# Patient Record
Sex: Male | Born: 1990 | Race: Black or African American | Hispanic: No | Marital: Single | Smoking: Current every day smoker
Health system: Southern US, Community
[De-identification: ages and names within clinical notes are randomized; demographics above are authoritative.]

## PROBLEM LIST (undated history)

## (undated) DIAGNOSIS — I10 Essential (primary) hypertension: Secondary | ICD-10-CM

---
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
## 2011-01-09 ENCOUNTER — Emergency Department (HOSPITAL_COMMUNITY)
Admission: EM | Admit: 2011-01-09 | Discharge: 2011-01-09 | Disposition: A | Discharge status: Home / Self Care | Payer: Medicaid Other | Attending: Emergency Medicine | Admitting: Emergency Medicine

## 2011-01-09 DIAGNOSIS — F988 Other specified behavioral and emotional disorders with onset usually occurring in childhood and adolescence: Secondary | ICD-10-CM | POA: Insufficient documentation

## 2011-01-09 DIAGNOSIS — R1033 Periumbilical pain: Secondary | ICD-10-CM | POA: Insufficient documentation

## 2011-01-09 DIAGNOSIS — R1032 Left lower quadrant pain: Secondary | ICD-10-CM | POA: Insufficient documentation

## 2011-01-09 LAB — CBC
HCT: 40.2 % (ref 39.0–52.0)
Hemoglobin: 14.5 g/dL (ref 13.0–17.0)
MCH: 29.2 pg (ref 26.0–34.0)
MCV: 81 fL (ref 78.0–100.0)
Platelets: 206 10*3/uL (ref 150–400)
RBC: 4.96 MIL/uL (ref 4.22–5.81)
WBC: 5.2 10*3/uL (ref 4.0–10.5)

## 2011-01-09 LAB — URINE MICROSCOPIC-ADD ON

## 2011-01-09 LAB — COMPREHENSIVE METABOLIC PANEL
ALT: 16 U/L (ref 0–53)
AST: 20 U/L (ref 0–37)
Alkaline Phosphatase: 82 U/L (ref 39–117)
Calcium: 10.1 mg/dL (ref 8.4–10.5)
Chloride: 102 mEq/L (ref 96–112)
Creatinine, Ser: 1.18 mg/dL (ref 0.50–1.35)
GFR calc non Af Amer: >60 mL/min (ref >60)
Potassium: 3.8 mEq/L (ref 3.5–5.1)

## 2011-01-09 LAB — URINALYSIS, ROUTINE W REFLEX MICROSCOPIC
Glucose, UA: NEGATIVE mg/dL
Leukocytes, UA: NEGATIVE
Specific Gravity, Urine: 1.039 — ABNORMAL HIGH (ref 1.005–1.030)
pH: 6 (ref 5.0–8.0)

## 2011-01-09 LAB — DIFFERENTIAL
Eosinophils Absolute: 0 10*3/uL (ref 0.0–0.7)
Lymphs Abs: 1.2 10*3/uL (ref 0.7–4.0)
Neutro Abs: 3.4 10*3/uL (ref 1.7–7.7)

## 2011-03-25 ENCOUNTER — Emergency Department (HOSPITAL_COMMUNITY): Payer: Medicaid Other

## 2011-03-25 ENCOUNTER — Emergency Department (HOSPITAL_COMMUNITY)
Admission: EM | Admit: 2011-03-25 | Discharge: 2011-03-25 | Discharge status: Against Medical Advice | Payer: Medicaid Other | Attending: Emergency Medicine | Admitting: Emergency Medicine

## 2011-03-25 DIAGNOSIS — R059 Cough, unspecified: Secondary | ICD-10-CM | POA: Insufficient documentation

## 2011-03-25 DIAGNOSIS — R05 Cough: Secondary | ICD-10-CM | POA: Insufficient documentation

## 2011-03-25 DIAGNOSIS — R110 Nausea: Secondary | ICD-10-CM | POA: Insufficient documentation

## 2011-03-25 DIAGNOSIS — R079 Chest pain, unspecified: Secondary | ICD-10-CM | POA: Insufficient documentation

## 2011-07-06 ENCOUNTER — Emergency Department (HOSPITAL_COMMUNITY)
Admission: EM | Admit: 2011-07-06 | Discharge: 2011-07-06 | Disposition: A | Discharge status: Home / Self Care | Payer: Medicaid Other | Attending: Emergency Medicine | Admitting: Emergency Medicine

## 2011-07-06 ENCOUNTER — Encounter (HOSPITAL_COMMUNITY): Payer: Self-pay | Admitting: *Deleted

## 2011-07-06 ENCOUNTER — Other Ambulatory Visit: Payer: Self-pay

## 2011-07-06 DIAGNOSIS — R51 Headache: Secondary | ICD-10-CM

## 2011-07-06 DIAGNOSIS — R079 Chest pain, unspecified: Secondary | ICD-10-CM | POA: Insufficient documentation

## 2011-07-06 DIAGNOSIS — M546 Pain in thoracic spine: Secondary | ICD-10-CM | POA: Insufficient documentation

## 2011-07-06 NOTE — ED Notes (Signed)
Received pt. From triage via w/c, pt. Alert and oriented, NAD noted, pt. C/o h/a and cp, cardiac monitor applied,

## 2011-07-06 NOTE — ED Notes (Signed)
The pt came in by ambulance.  He is c/o a headache and chest pain since this am.  He has a previous history of the same

## 2011-07-06 NOTE — ED Notes (Signed)
Pt. Discharged to home, pt. Alert and oriented, NAD noted, ambulatory gait steady 

## 2011-07-06 NOTE — ED Provider Notes (Signed)
History     CSN: [id number]  Arrival date & time 07/06/11  0137   First MD Initiated Contact with Patient 07/06/11 0441      Chief Complaint  Patient presents with  . Headache    (Consider location/radiation/quality/duration/timing/severity/associated sxs/prior treatment) HPI Lee [name] is a 21 y.o. male presents with c/o  chest pain, and headache  leading to desire to be assessed in the ED. The sx(s) have been present for  1 to . Additional concerns are  not . Causative factors are  not known . Palliative factors are nothing tried . The distress associated is , mild . The disorder has been present for one day . Patient denies associated nausea, vomiting, weakness, dizziness, sinus congestion, back pain, or trouble walking.     History reviewed. [name] pertinent past medical history.  History reviewed. [name] pertinent past surgical history.  History reviewed. [name] pertinent family history.  History  Substance Use Topics  . Smoking status: Current Everyday Smoker  . Smokeless tobacco: Not on file  . Alcohol Use: [name]      Review of Systems  All other systems reviewed and are negative.    Allergies  Review of patient's allergies indicates [name] known allergies.  Home Medications  [name] current outpatient prescriptions on file.  BP 105/55  Pulse 96  Temp(Src) 98.1 F (36.7 C) (Oral)  Resp 21  SpO2 98%  Physical Exam  Nursing note and vitals reviewed. Constitutional: He is oriented to person, place, and time. He appears well-developed and well-nourished.  HENT:  Head: Normocephalic and atraumatic.  Right Ear: External ear normal.  Left Ear: External ear normal.  Eyes: Conjunctivae and EOM are normal. Pupils are equal, round, and reactive to light.  Neck: Normal range of motion and phonation normal. Neck supple.  Cardiovascular: Normal rate, regular rhythm, normal heart sounds and intact distal pulses.   Pulmonary/Chest: Effort normal and breath sounds normal. He  exhibits [name] bony tenderness.  Abdominal: Soft. Normal appearance. There is [name] tenderness.  Musculoskeletal: Normal range of motion.       Mild mid thoracic spinal tenderness; [name] deformity or step-off.   Neurological: He is alert and oriented to person, place, and time. He has normal strength. [name] cranial nerve deficit or sensory deficit. He exhibits normal muscle tone. Coordination normal.       Normal gait  Skin: Skin is warm, dry and intact.  Psychiatric: He has a normal mood and affect. His behavior is normal. Judgment and thought content normal.    ED Course  Procedures (including critical care time)  Date: 07/06/2011  Rate: 65  Rhythm: normal sinus rhythm  QRS Axis: normal  Intervals: normal  ST/T Wave abnormalities: normal  Conduction Disutrbances:none  Narrative Interpretation:   Old EKG Reviewed: none available     Labs Reviewed - [name] data to display [name] results found.   1. Headache   2. Chest pain       MDM  Nonspecific Chest Pain , with normal EKG. , Patient is nontoxic. Doubt ACS, meningitis, metabolic instability or occult infection.        Flint Melter, MD 07/06/11 [phone]

## 2011-07-23 ENCOUNTER — Encounter (HOSPITAL_COMMUNITY): Payer: Self-pay | Admitting: *Deleted

## 2011-07-23 ENCOUNTER — Emergency Department (HOSPITAL_COMMUNITY)
Admission: EM | Admit: 2011-07-23 | Discharge: 2011-07-23 | Disposition: A | Discharge status: Home / Self Care | Payer: Medicaid Other | Attending: Emergency Medicine | Admitting: Emergency Medicine

## 2011-07-23 DIAGNOSIS — S29011A Strain of muscle and tendon of front wall of thorax, initial encounter: Secondary | ICD-10-CM

## 2011-07-23 DIAGNOSIS — R42 Dizziness and giddiness: Secondary | ICD-10-CM | POA: Insufficient documentation

## 2011-07-23 DIAGNOSIS — IMO0002 Reserved for concepts with insufficient information to code with codable children: Secondary | ICD-10-CM | POA: Insufficient documentation

## 2011-07-23 DIAGNOSIS — X58XXXA Exposure to other specified factors, initial encounter: Secondary | ICD-10-CM | POA: Insufficient documentation

## 2011-07-23 DIAGNOSIS — R079 Chest pain, unspecified: Secondary | ICD-10-CM | POA: Insufficient documentation

## 2011-07-23 MED ORDER — IBUPROFEN 600 MG PO TABS: 600.0000 mg | ORAL_TABLET | Freq: Four times a day (QID) | ORAL | Status: AC | PRN

## 2011-07-23 MED ORDER — KETOROLAC TROMETHAMINE 30 MG/ML IJ SOLN
30.0000 mg | Freq: Once | INTRAMUSCULAR | Status: AC
  Administered 2011-07-23: 30 mg via INTRAMUSCULAR
  Filled 2011-07-23: qty 1

## 2011-07-23 NOTE — ED Notes (Signed)
Per EMS:  Pt has had flu like symptoms for the past 5 days, st's he felt a pain in his chest that was "moving around" starting tonight.

## 2011-07-23 NOTE — ED Provider Notes (Signed)
History     CSN: [id number]  Arrival date & time 07/23/11  0128   First MD Initiated Contact with Patient 07/23/11 0142      Chief Complaint  Patient presents with  . Influenza    flu like sympotms     (Consider location/radiation/quality/duration/timing/severity/associated sxs/prior treatment) HPI Comments: His ago, while lifting weights.  He felt a pain across his chest when he stopped the pain went away.  He noticed the next day while playing basketball he had the same pain and  when he stopped playing basketball the pain went away.  He has repeated this for the last 5 days, has taken no over-the-counter medicines, but tonight, he stated the pain made him dizzy as well.  He called EMS to bring him to the emergency room. At this time he is  not having any pain nor is he dizzy  Patient is a 21 y.o. male presenting with flu symptoms. The history is provided by the patient.  Influenza The current episode started in the past 7 days. Associated symptoms include chest pain. Pertinent negatives include no chills, coughing, fatigue, nausea, vomiting or weakness. The symptoms are aggravated by exertion. He has tried nothing for the symptoms. The treatment provided mild relief.    History reviewed. No pertinent past medical history.  History reviewed. No pertinent past surgical history.  History reviewed. No pertinent family history.  History  Substance Use Topics  . Smoking status: Current Everyday Smoker  . Smokeless tobacco: Not on file  . Alcohol Use: No      Review of Systems  Constitutional: Negative for chills and fatigue.  HENT: Negative for rhinorrhea.   Respiratory: Negative for cough.   Cardiovascular: Positive for chest pain.  Gastrointestinal: Negative for nausea and vomiting.  Genitourinary: Negative for dysuria.  Musculoskeletal: Negative for back pain.  Neurological: Positive for dizziness. Negative for weakness.    Allergies  Review of patient's allergies  indicates no known allergies.  Home Medications   Current Outpatient Rx  Name Route Sig Dispense Refill  . IBUPROFEN 600 MG PO TABS Oral Take 1 tablet (600 mg total) by mouth every 6 (six) hours as needed for pain. 30 tablet 0  . IBUPROFEN 600 MG PO TABS Oral Take 1 tablet (600 mg total) by mouth every 6 (six) hours as needed for pain. 30 tablet 0    BP 121/81  Pulse 72  Temp(Src) 98.1 F (36.7 C) (Oral)  Resp 18  SpO2 100%  Physical Exam  Constitutional: He is oriented to person, place, and time. He appears well-developed.  HENT:  Head: Normocephalic.  Eyes: Pupils are equal, round, and reactive to light.  Cardiovascular: Normal rate.   Pulmonary/Chest: Effort normal. No respiratory distress. He has no wheezes. He has no rales. He exhibits tenderness.  Abdominal: Bowel sounds are normal.  Musculoskeletal: Normal range of motion.  Neurological: He is alert and oriented to person, place, and time.  Skin: Skin is warm.    ED Course  Procedures (including critical care time)  Labs Reviewed - No data to display No results found.  1. Chest wall muscle strain       MDM  This patient has reproducible chest pain will ask the nurse to administer IM, Toradol and reassess Pain has improved        Arman Filter, NP 07/23/11 0156  Arman Filter, NP 07/23/11 [phone]

## 2011-07-23 NOTE — ED Notes (Signed)
[id number]<AV> Expected date:<BR> Expected time:<BR> Means of arrival:<BR> Comments:<BR> EMS/20 yo with flu like symptoms

## 2011-07-23 NOTE — ED Notes (Signed)
Pt presents with a  C/c of flu like symptoms for 5 days, just developed chest pain tonight that "is off to the left of the chest and does not radiate"  St's he's had a cough lately that contributes to the chest pain.  No acute distress, no abdominal pain, no n/v/d, no SOB.

## 2011-07-23 NOTE — ED Provider Notes (Signed)
Medical screening examination/treatment/procedure(s) were performed by non-physician practitioner and as supervising physician I was immediately available for consultation/collaboration.   Laray Anger, DO 07/23/11 [phone]

## 2012-04-03 IMAGING — CR DG CHEST 2V
2 series · 2 of 2 positions shown · non-contrast
Comparison: None.

CLINICAL DATA: 19-year-old with chest pain.

CHEST - 2 VIEW

[w chest pa]
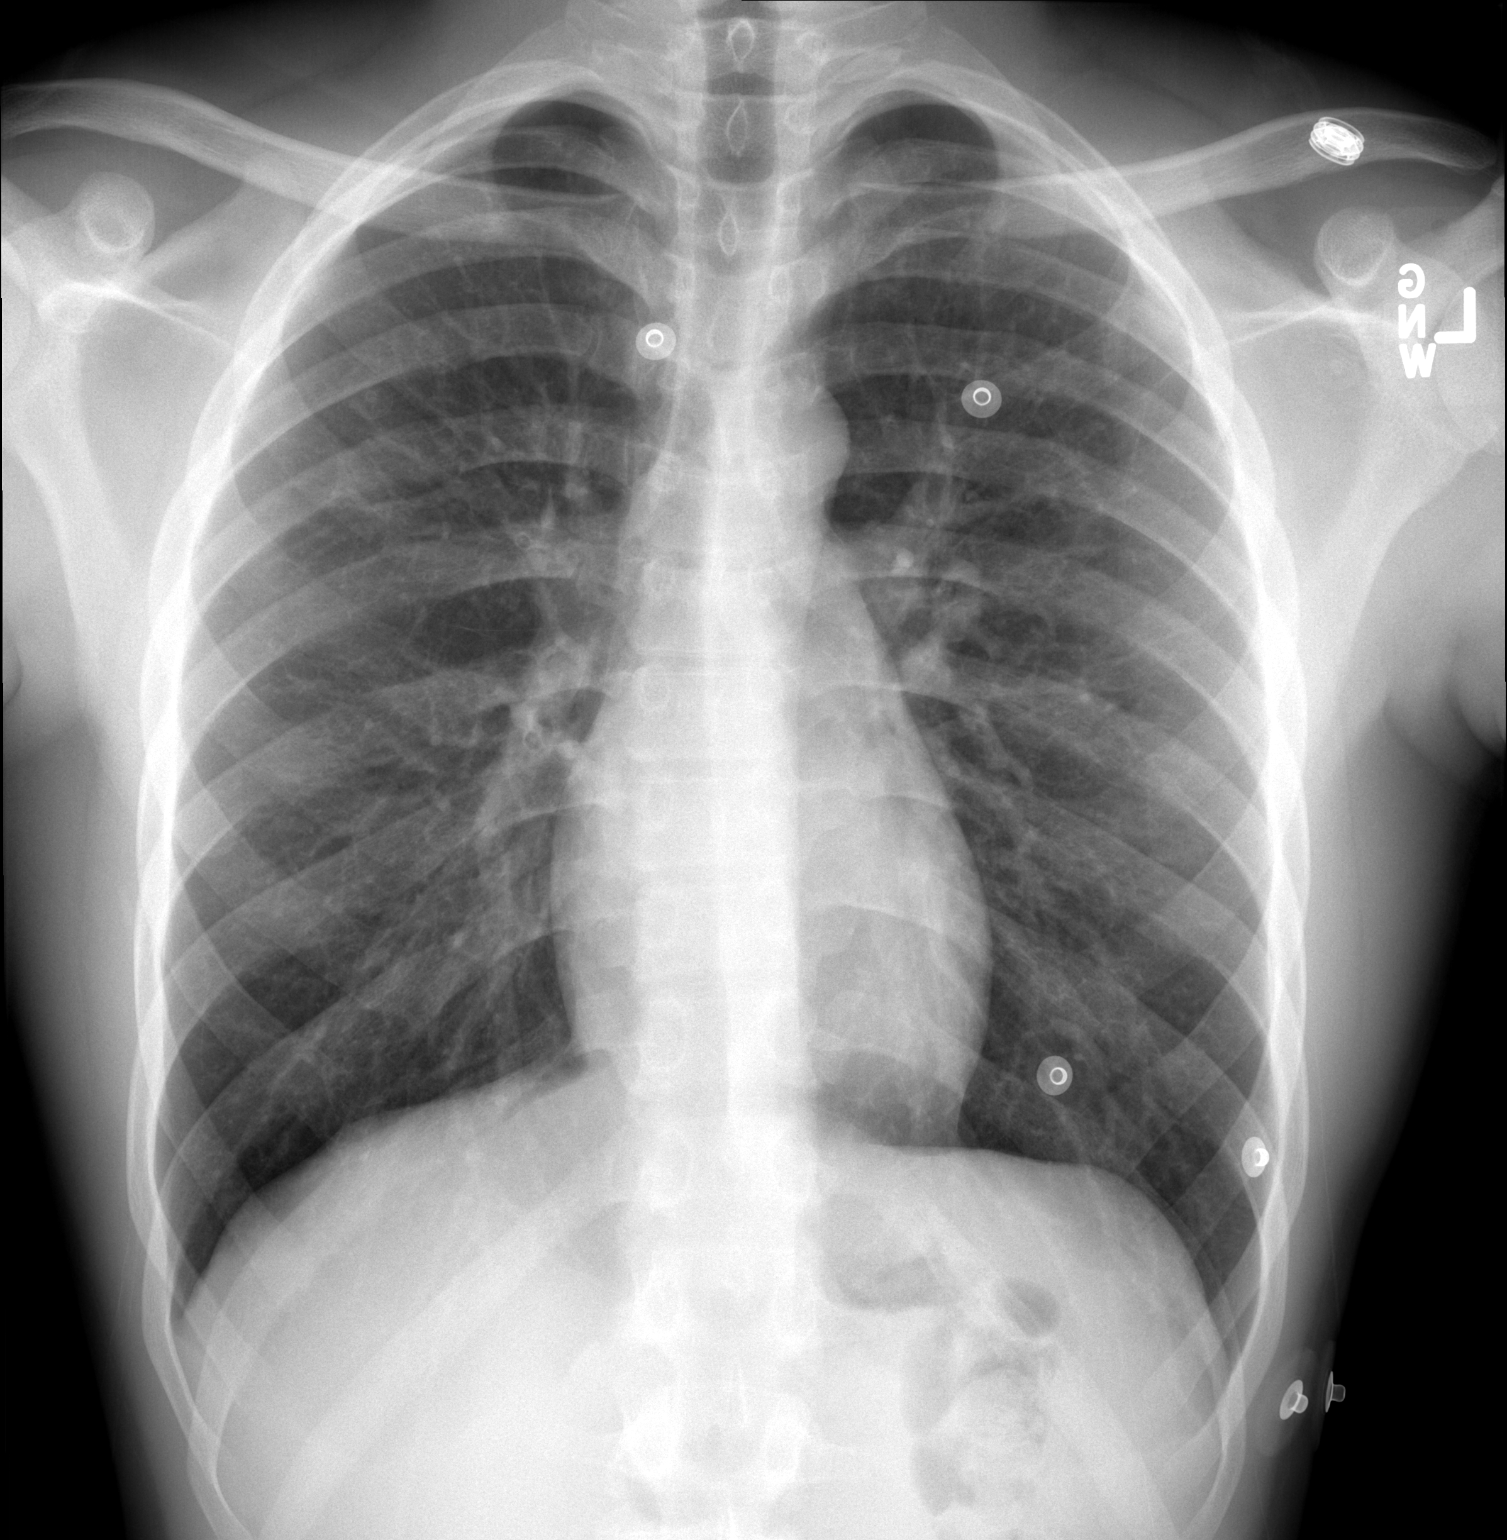

[w chest lat]
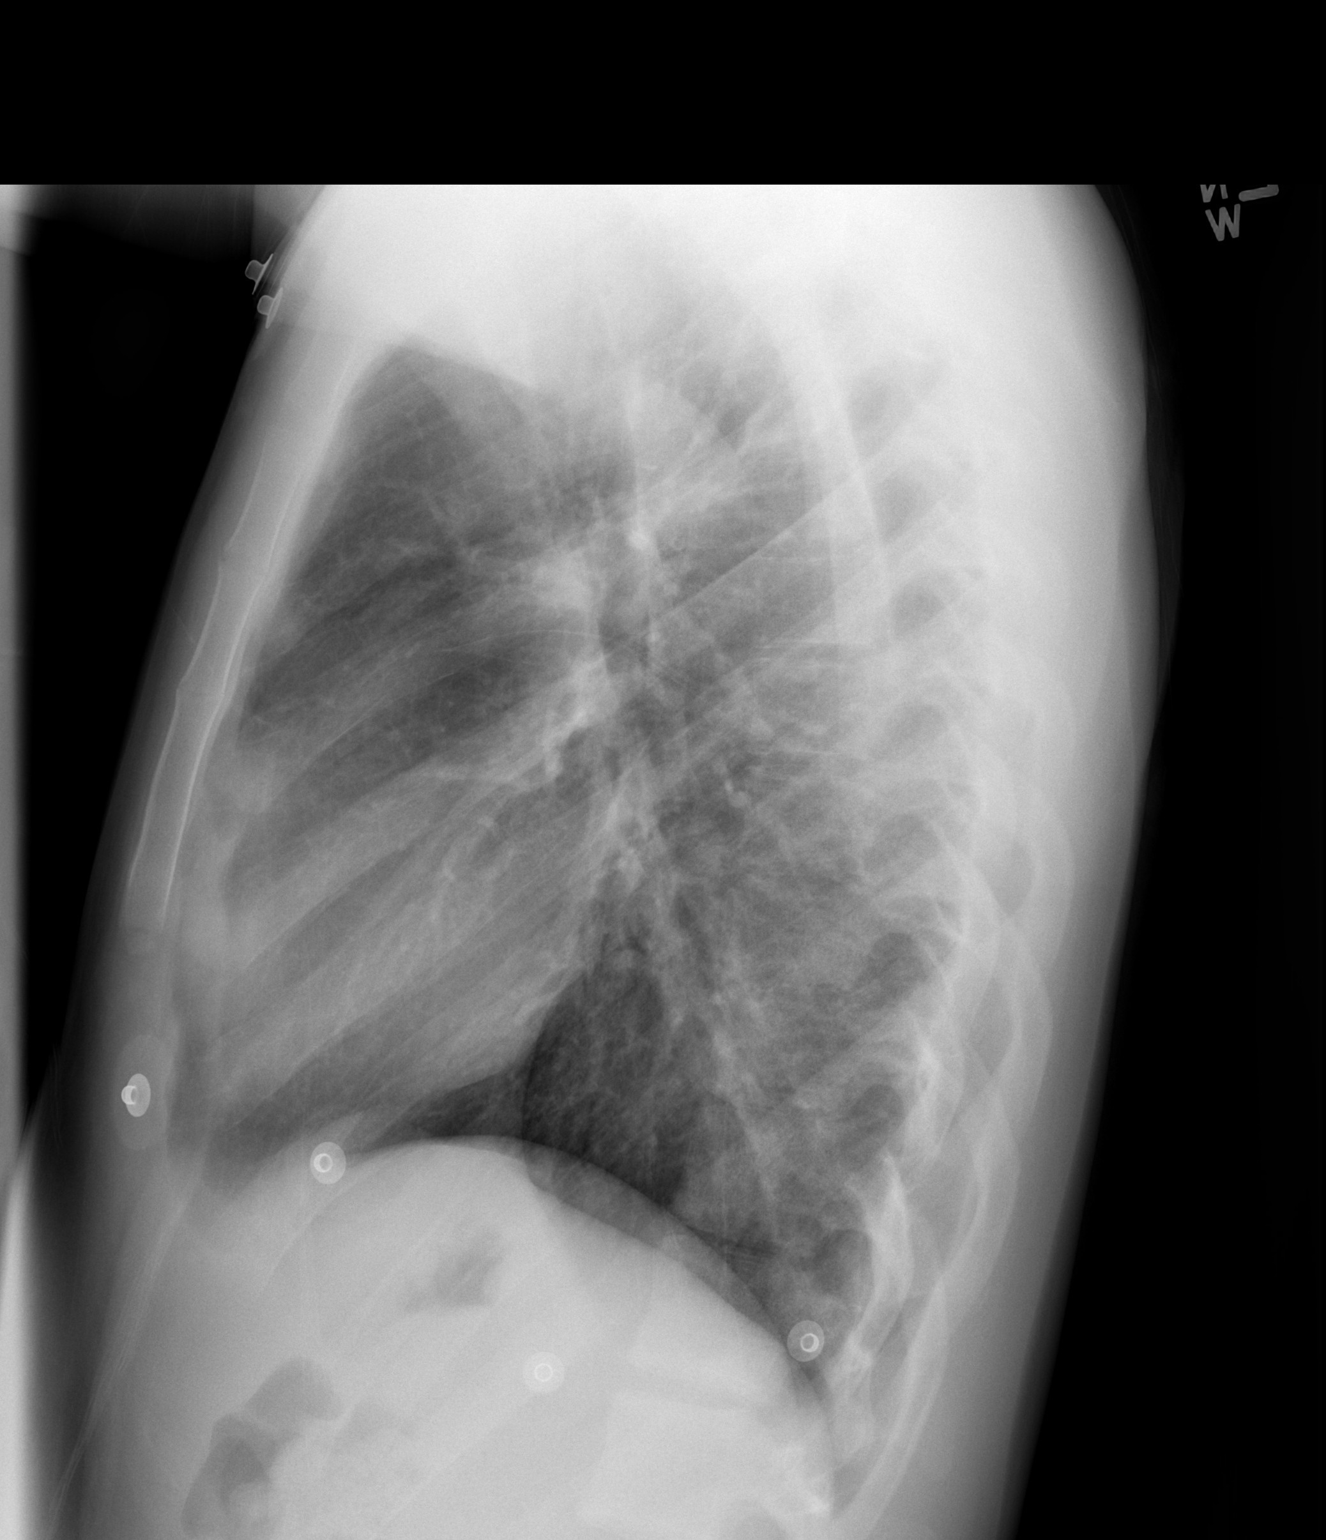

[2 of 2 positions shown; findings below may reference images not displayed]

FINDINGS: The heart size and mediastinal contours are normal. The
lungs are clear. There is no pleural effusion or pneumothorax. No
acute osseous findings are identified.
IMPRESSION: No active cardiopulmonary process.

## 2015-10-14 ENCOUNTER — Emergency Department (HOSPITAL_COMMUNITY)
Admission: EM | Admit: 2015-10-14 | Discharge: 2015-10-14 | Disposition: A | Discharge status: Home / Self Care | Payer: Medicaid Other | Attending: Emergency Medicine | Admitting: Emergency Medicine

## 2015-10-14 ENCOUNTER — Encounter (HOSPITAL_COMMUNITY): Payer: Self-pay | Admitting: Family Medicine

## 2015-10-14 ENCOUNTER — Emergency Department (HOSPITAL_COMMUNITY): Payer: Medicaid Other

## 2015-10-14 DIAGNOSIS — Y998 Other external cause status: Secondary | ICD-10-CM | POA: Insufficient documentation

## 2015-10-14 DIAGNOSIS — X58XXXA Exposure to other specified factors, initial encounter: Secondary | ICD-10-CM | POA: Diagnosis not present

## 2015-10-14 DIAGNOSIS — T148XXA Other injury of unspecified body region, initial encounter: Secondary | ICD-10-CM

## 2015-10-14 DIAGNOSIS — M545 Low back pain, unspecified: Secondary | ICD-10-CM

## 2015-10-14 DIAGNOSIS — R059 Cough, unspecified: Secondary | ICD-10-CM

## 2015-10-14 DIAGNOSIS — F17210 Nicotine dependence, cigarettes, uncomplicated: Secondary | ICD-10-CM | POA: Diagnosis not present

## 2015-10-14 DIAGNOSIS — R05 Cough: Secondary | ICD-10-CM | POA: Diagnosis not present

## 2015-10-14 DIAGNOSIS — Y9389 Activity, other specified: Secondary | ICD-10-CM | POA: Insufficient documentation

## 2015-10-14 DIAGNOSIS — Y9289 Other specified places as the place of occurrence of the external cause: Secondary | ICD-10-CM | POA: Insufficient documentation

## 2015-10-14 DIAGNOSIS — T148 Other injury of unspecified body region: Secondary | ICD-10-CM | POA: Insufficient documentation

## 2015-10-14 LAB — URINALYSIS, ROUTINE W REFLEX MICROSCOPIC
Bilirubin Urine: NEGATIVE
GLUCOSE, UA: NEGATIVE mg/dL
HGB URINE DIPSTICK: NEGATIVE
Ketones, ur: 15 mg/dL — AB
Leukocytes, UA: NEGATIVE
Nitrite: NEGATIVE
PH: 7 (ref 5.0–8.0)
PROTEIN: NEGATIVE mg/dL
Specific Gravity, Urine: 1.026 (ref 1.005–1.030)

## 2015-10-14 MED ORDER — CETIRIZINE HCL 10 MG PO TABS: 10.0000 mg | ORAL_TABLET | Freq: Every day | ORAL | Status: AC

## 2015-10-14 MED ORDER — IBUPROFEN 400 MG PO TABS
800.0000 mg | ORAL_TABLET | Freq: Once | ORAL | Status: AC
  Administered 2015-10-14: 800 mg via ORAL
  Filled 2015-10-14: qty 2

## 2015-10-14 MED ORDER — CYCLOBENZAPRINE HCL 5 MG PO TABS: 5.0000 mg | ORAL_TABLET | Freq: Every day | ORAL | Status: AC

## 2015-10-14 MED ORDER — ALBUTEROL SULFATE (2.5 MG/3ML) 0.083% IN NEBU: 5.0000 mg | INHALATION_SOLUTION | Freq: Once | RESPIRATORY_TRACT | Status: DC

## 2015-10-14 MED ORDER — IBUPROFEN 800 MG PO TABS: 800.0000 mg | ORAL_TABLET | Freq: Three times a day (TID) | ORAL | Status: AC

## 2015-10-14 NOTE — ED Provider Notes (Signed)
CSN: [id number]     Arrival date & time 10/14/15  1223 History  By signing my name below, I, Lee Brown, attest that this documentation has been prepared under the direction and in the presence of Lee Fowler, PA-C Electronically Signed: Soijett Brown, ED Scribe. 10/14/2015. 2:15 PM.   Chief Complaint  Patient presents with  . Back Pain  . Cough      The history is provided by the patient. No language interpreter was used.    HPI Comments: Lee Brown is a 25 y.o. male who presents to the Emergency Department complaining of sharp, non-radiating, progressively worsening, left lower back pain onset this morning. Pt notes that he feels a "catch" with movement of his left lower back. Pt denies any injury or trauma. He states that he has not tried any medications for the  with no relief for his symptoms. Pt denies CP, bowel/bladder incontinence, fever, abdominal pain, n/v, hematuria, dysuria, numbness, weakness, gait problem, and any other symptoms. Denies CA or IV drug use. Denies having kidney stones in the past.  Pt secondarily complains of cough x 2 weeks. Pt thinks that his seasonal allergies are causing the issue. Pt has has associated symptoms of watery eyes. Pt has tried benadryl with no relief of his symptoms. Pt denies sore throat, nasal congestion, ear pain, and any other symptoms. Pt notes that he smokes 2 cigarettes daily. Pt PCP is Dr. Jovita Brown.     History reviewed. No pertinent past medical history. History reviewed. No pertinent past surgical history. History reviewed. No pertinent family history. Social History  Substance Use Topics  . Smoking status: Current Every Day Smoker  . Smokeless tobacco: None  . Alcohol Use: No    Review of Systems  Constitutional: Negative for fever and chills.  HENT: Positive for congestion. Negative for ear pain, rhinorrhea and sore throat.   Respiratory: Positive for cough.   Cardiovascular: Negative for chest pain.   Gastrointestinal: Negative for abdominal pain.       No bowel incontinence  Genitourinary: Negative for dysuria and hematuria.       No bladder incontinence  Musculoskeletal: Positive for back pain. Negative for joint swelling and gait problem.  Skin: Negative for color change, pallor, rash and wound.  Neurological: Negative for weakness and numbness.       No tingling  All other systems reviewed and are negative.     Allergies  Review of patient's allergies indicates no known allergies.  Home Medications   Prior to Admission medications   Medication Sig Start Date End Date Taking? Authorizing Provider  cetirizine (ZYRTEC) 10 MG tablet Take 1 tablet (10 mg total) by mouth daily. 10/14/15   Lee Fowler, PA-C  cyclobenzaprine (FLEXERIL) 5 MG tablet Take 1 tablet (5 mg total) by mouth at bedtime. 10/14/15   Lee Fowler, PA-C  ibuprofen (ADVIL,MOTRIN) 800 MG tablet Take 1 tablet (800 mg total) by mouth 3 (three) times daily. 10/14/15   Lee Sweatman, PA-C   BP 100/57 mmHg  Pulse 78  Temp(Src) 98.2 F (36.8 C) (Oral)  Resp 16  SpO2 100% Physical Exam  Constitutional: He is oriented to person, place, and time. He appears well-developed and well-nourished.  Non-toxic appearance. He does not have a sickly appearance. He does not appear ill.  HENT:  Head: Normocephalic and atraumatic.  Mouth/Throat: Oropharynx is clear and moist.  Eyes: Conjunctivae are normal. Pupils are equal, round, and reactive to light.  Neck: Normal range of motion. Neck  supple.  No nuchal rigidity.   Cardiovascular: Normal rate, regular rhythm, normal heart sounds and intact distal pulses.   No murmur heard. Pulses:      Dorsalis pedis pulses are 2+ on the right side, and 2+ on the left side.  Pulmonary/Chest: Effort normal and breath sounds normal. No accessory muscle usage or stridor. No respiratory distress. He has no wheezes. He has no rhonchi. He has no rales.  Abdominal: Soft. Bowel sounds are normal. He  exhibits no distension. There is no tenderness. There is no rebound, no guarding and no CVA tenderness.  Musculoskeletal: Normal range of motion. He exhibits tenderness.       Back:  No c/t/l/s midline tenderness. No spinous process tenderness.  No step offs. No crepitus.  Lymphadenopathy:    He has no cervical adenopathy.  Neurological: He is alert and oriented to person, place, and time.  Speech clear without dysarthria.  Skin: Skin is warm and dry.  Psychiatric: He has a normal mood and affect. His behavior is normal.  Nursing note and vitals reviewed.   ED Course  Procedures (including critical care time) DIAGNOSTIC STUDIES: Oxygen Saturation is 100% on RA, nl by my interpretation.    COORDINATION OF CARE: 2:13 PM Discussed treatment plan with pt at bedside which includes azithromycin, tessalon perles, UA and pt agreed to plan.    Labs Review Labs Reviewed  URINALYSIS, ROUTINE W REFLEX MICROSCOPIC (NOT AT [facility]) - Abnormal; Notable for the following:    Ketones, ur 15 (*)    All other components within normal limits    Imaging Review Dg Chest 2 View  10/14/2015  CLINICAL DATA:  Chest pain with cough EXAM: CHEST  2 VIEW COMPARISON:  October 11, 2010 FINDINGS: The heart size and mediastinal contours are within normal limits. There is no focal infiltrate, pulmonary edema, or pleural effusion. There is scoliosis of spine. IMPRESSION: No active cardiopulmonary disease. Electronically Signed   By: Sherian ReinWei-Chen  Lin M.D.   On: 10/14/2015 13:15   I have personally reviewed and evaluated these images and lab results as part of my medical decision-making.   EKG Interpretation None      MDM   Final diagnoses:  Muscle strain  Left-sided low back pain without sciatica  Cough   Patient with back pain.  No neurological deficits and normal neuro exam.  Patient is ambulatory.  No loss of bowel or bladder control.  No concern for cauda equina.  No fever, night sweats, weight loss, h/o  cancer, IVDA, no recent procedure to back. No urinary symptoms suggestive of UTI. No CVA tenderness.  UA negative.  Doubt pyelonephritis or urolithiasis. Supportive care and return precaution discussed. Appears safe for discharge at this time. Follow up as indicated in discharge paperwork.   Pt symptoms consistent with allergies. CXR negative for acute infiltrate. Pt will be discharged with zyrtec.  Discussed return precautions.  Pt is hemodynamically stable & in NAD prior to discharge.  I personally performed the services described in this documentation, which was scribed in my presence. The recorded information has been reviewed and is accurate.    Lee FowlerKayla Shelsie Tijerino, PA-C 10/14/15 1552  Alvira MondayErin Schlossman, MD 10/15/15 1659

## 2015-10-14 NOTE — ED Notes (Signed)
Patient able to ambulate independently  

## 2015-10-14 NOTE — ED Notes (Signed)
Pt here with cough, pain with deep inspiration for a few weeks.

## 2015-10-14 NOTE — Discharge Instructions (Signed)
His is likely a muscle strain. Please take ibuprofen 3 times daily as needed for pain. You may also take Flexeril at bedtime. Please follow-up with your primary care doctor for persistent pain. Please return to the emergency Department if your pain becomes worse, you experience weakness or numbness, or have difficulty walking.  Back Pain, Adult Back pain is very common in adults.The cause of back pain is rarely dangerous and the pain often gets better over time.The cause of your back pain may not be known. Some common causes of back pain include:  Strain of the muscles or ligaments supporting the spine.  Wear and tear (degeneration) of the spinal disks.  Arthritis.  Direct injury to the back. For many people, back pain may return. Since back pain is rarely dangerous, most people can learn to manage this condition on their own. HOME CARE INSTRUCTIONS Watch your back pain for any changes. The following actions may help to lessen any discomfort you are feeling:  Remain active. It is stressful on your back to sit or stand in one place for long periods of time. Do not sit, drive, or stand in one place for more than 30 minutes at a time. Take short walks on even surfaces as soon as you are able.Try to increase the length of time you walk each day.  Exercise regularly as directed by your health care provider. Exercise helps your back heal faster. It also helps avoid future injury by keeping your muscles strong and flexible.  Do not stay in bed.Resting more than 1-2 days can delay your recovery.  Pay attention to your body when you bend and lift. The most comfortable positions are those that put less stress on your recovering back. Always use proper lifting techniques, including:  Bending your knees.  Keeping the load close to your body.  Avoiding twisting.  Find a comfortable position to sleep. Use a firm mattress and lie on your side with your knees slightly bent. If you lie on your back,  put a pillow under your knees.  Avoid feeling anxious or stressed.Stress increases muscle tension and can worsen back pain.It is important to recognize when you are anxious or stressed and learn ways to manage it, such as with exercise.  Take medicines only as directed by your health care provider. Over-the-counter medicines to reduce pain and inflammation are often the most helpful.Your health care provider may prescribe muscle relaxant drugs.These medicines help dull your pain so you can more quickly return to your normal activities and healthy exercise.  Apply ice to the injured area:  Put ice in a plastic bag.  Place a towel between your skin and the bag.  Leave the ice on for 20 minutes, 2-3 times a day for the first 2-3 days. After that, ice and heat may be alternated to reduce pain and spasms.  Maintain a healthy weight. Excess weight puts extra stress on your back and makes it difficult to maintain good posture. SEEK MEDICAL CARE IF:  You have pain that is not relieved with rest or medicine.  You have increasing pain going down into the legs or buttocks.  You have pain that does not improve in one week.  You have night pain.  You lose weight.  You have a fever or chills. SEEK IMMEDIATE MEDICAL CARE IF:   You develop new bowel or bladder control problems.  You have unusual weakness or numbness in your arms or legs.  You develop nausea or vomiting.  You develop  abdominal pain.  You feel faint.   This information is not intended to replace advice given to you by your health care provider. Make sure you discuss any questions you have with your health care provider.   Document Released: 06/07/2005 Document Revised: 06/28/2014 Document Reviewed: 10/09/2013 Elsevier Interactive Patient Education [employer].

## 2017-04-06 IMAGING — DX DG CHEST 2V
2 series · 2 of 2 positions shown · non-contrast
Comparison: October 11, 2010

CLINICAL DATA: Chest pain with cough

EXAM:
CHEST  2 VIEW

[chest pa]
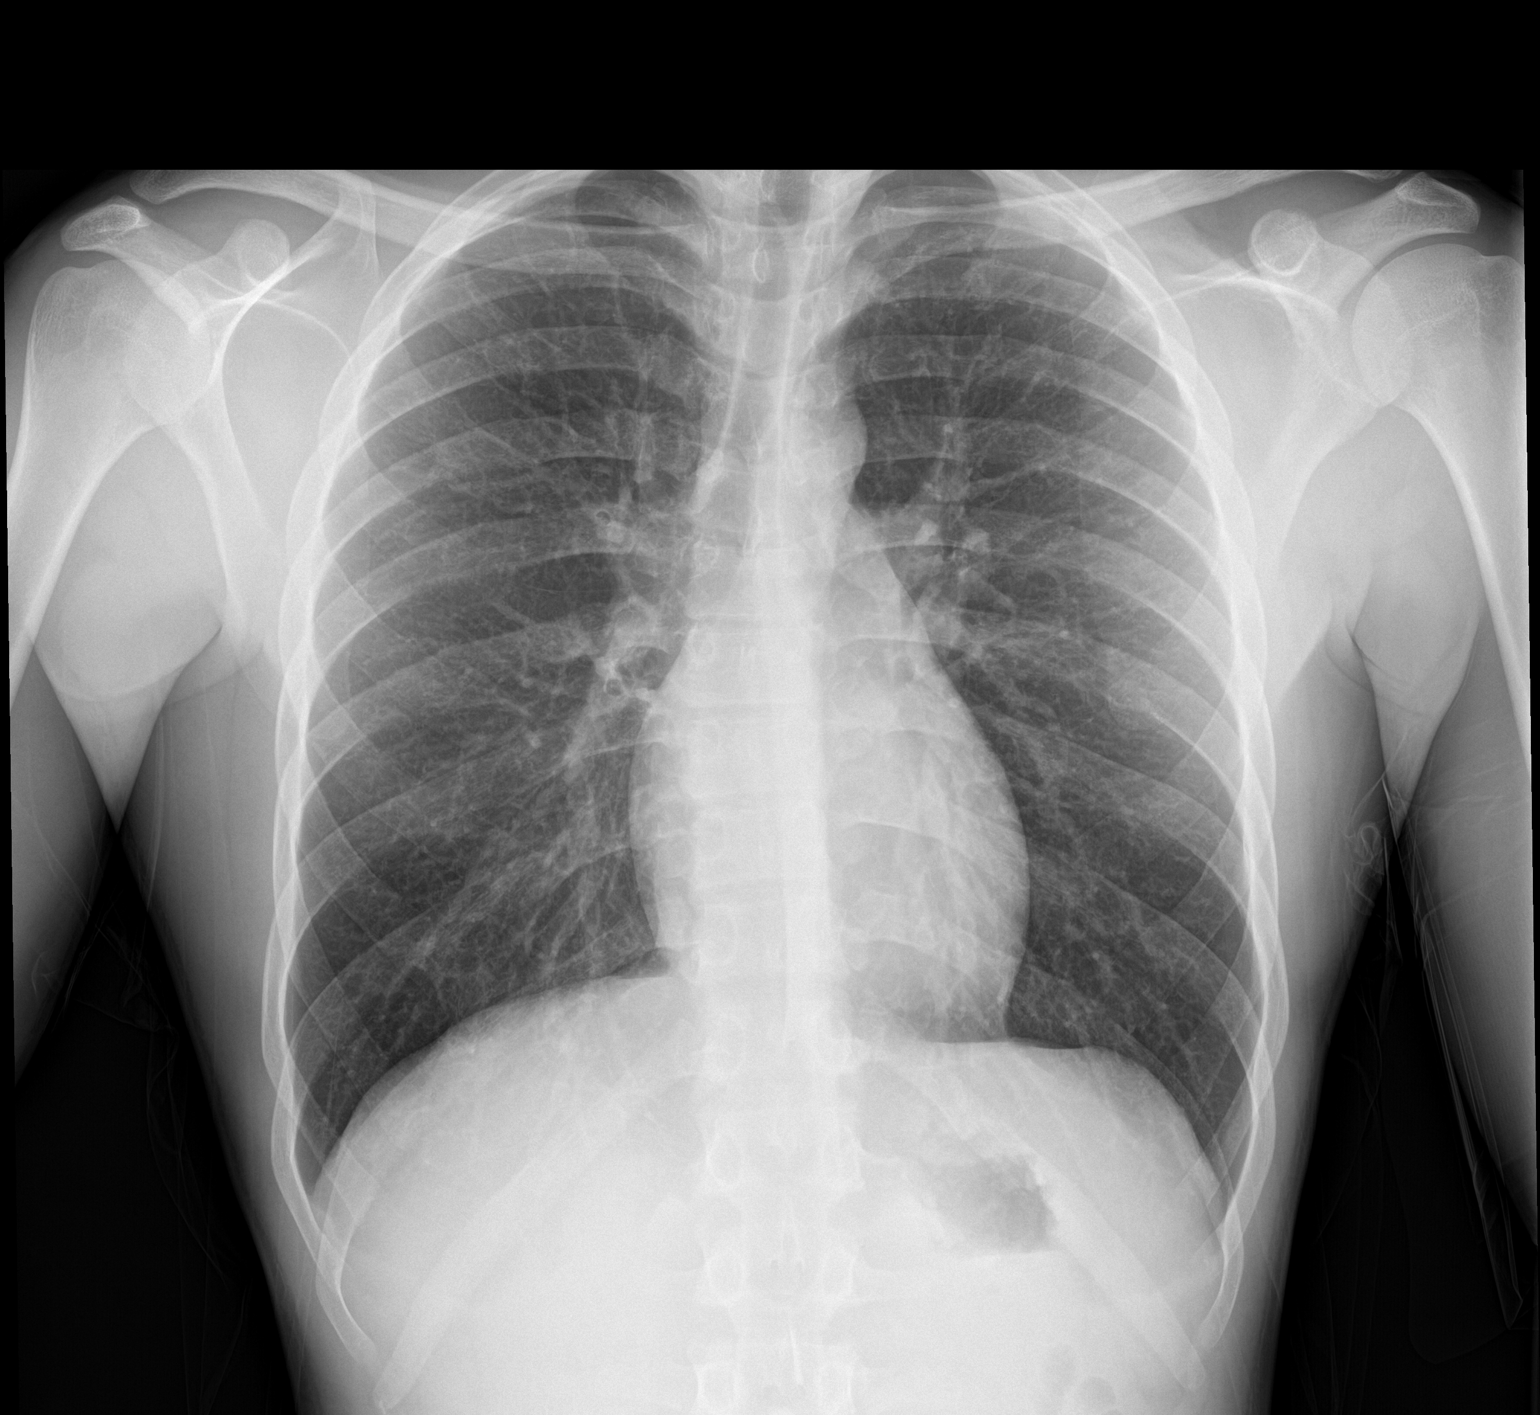

[chest lat]
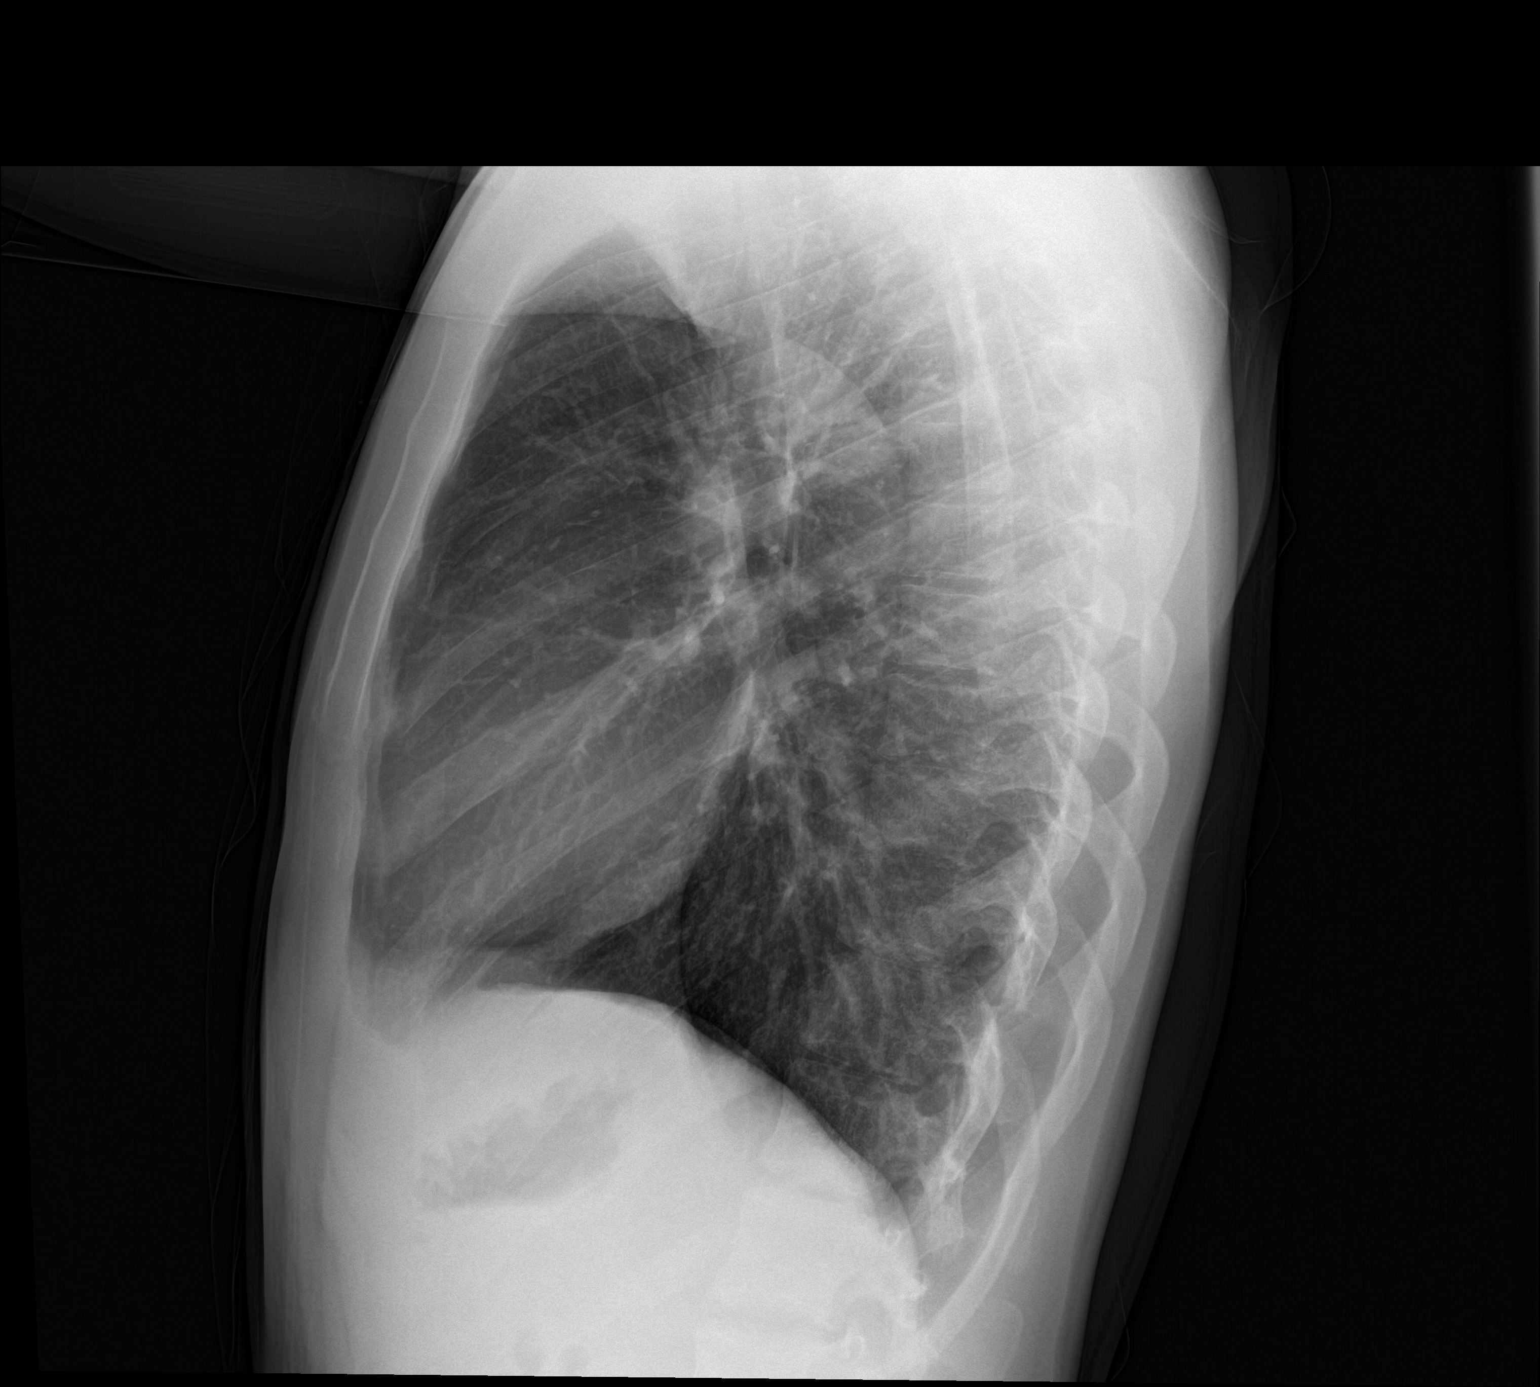

[2 of 2 positions shown; findings below may reference images not displayed]

FINDINGS: The heart size and mediastinal contours are within normal limits.
There is no focal infiltrate, pulmonary edema, or pleural effusion.
There is scoliosis of spine.
IMPRESSION: No active cardiopulmonary disease.

## 2020-05-09 ENCOUNTER — Other Ambulatory Visit: Payer: Self-pay

## 2020-05-09 ENCOUNTER — Encounter (HOSPITAL_COMMUNITY): Payer: Self-pay

## 2020-05-09 ENCOUNTER — Ambulatory Visit (HOSPITAL_COMMUNITY)
Admission: EM | Admit: 2020-05-09 | Discharge: 2020-05-09 | Disposition: A | Discharge status: Home / Self Care | Payer: Self-pay | Attending: Emergency Medicine | Admitting: Emergency Medicine

## 2020-05-09 DIAGNOSIS — R21 Rash and other nonspecific skin eruption: Secondary | ICD-10-CM

## 2020-05-09 HISTORY — DX: Essential (primary) hypertension: I10

## 2020-05-09 MED ORDER — PREDNISONE 10 MG (21) PO TBPK: ORAL_TABLET | Freq: Every day | ORAL | 0 refills | Status: AC

## 2020-05-09 MED ORDER — TRIAMCINOLONE ACETONIDE 0.1 % EX CREA: 1.0000 "application " | TOPICAL_CREAM | Freq: Two times a day (BID) | CUTANEOUS | 0 refills | Status: AC

## 2020-05-09 NOTE — ED Provider Notes (Signed)
[facility]    CSN: [id number] Arrival date & time: 05/09/20  1609      History   Chief Complaint Chief Complaint  Patient presents with   Rash    HPI Lee Brown is a 29 y.o. male.   Lee Brown presents with complaints of  Itchy rash to thighs, under arms, chest, upper back. Noted it over the past few days, felt like maybe it was related to washing his sheeting, but now he is here visiting from out of town and still with rash. Hydrocortisone did help. History of eczema as a child. No other previous similar he is aware of.     ROS per HPI, negative if not otherwise mentioned.      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 No pertinent surgical history.     Home Medications    Prior to Admission medications   Medication Sig Start Date End Date Taking? Authorizing Provider  cetirizine (ZYRTEC) 10 MG tablet Take 1 tablet (10 mg total) by mouth daily. 10/14/15   Cheri Fowler, PA-C  cyclobenzaprine (FLEXERIL) 5 MG tablet Take 1 tablet (5 mg total) by mouth at bedtime. 10/14/15   Cheri Fowler, PA-C  ibuprofen (ADVIL,MOTRIN) 800 MG tablet Take 1 tablet (800 mg total) by mouth 3 (three) times daily. 10/14/15   Cheri Fowler, PA-C  predniSONE (STERAPRED UNI-PAK 21 TAB) 10 MG (21) TBPK tablet Take by mouth daily. Per box instruction 05/09/20   Linus Mako B, NP  triamcinolone (KENALOG) 0.1 % Apply 1 application topically 2 (two) times daily. 05/09/20   Georgetta Haber, NP    Family History Family History  Problem Relation Age of Onset   Hypertension Paternal Grandmother     Social History Social History   Tobacco Use   Smoking status: Current Every Day Smoker   Smokeless tobacco: Never Used  Substance Use Topics   Alcohol use: No   Drug use: Never      Allergies   Patient has no known allergies.   Review of Systems Review of Systems   Physical Exam Triage Vital Signs ED Triage Vitals  Enc Vitals Group     BP 05/09/20 1628 124/74     Pulse Rate 05/09/20 1628 87     Resp 05/09/20 1628 20     Temp 05/09/20 1628 99 F (37.2 C)     Temp Source 05/09/20 1628 Oral     SpO2 05/09/20 1628 100 %     Weight --      Height --      Head Circumference --      Peak Flow --      Pain Score 05/09/20 1627 0     Pain Loc --      Pain Edu? --      Excl. in GC? --    No data found.  Updated Vital Signs BP 124/74 (BP Location: Left Arm)    Pulse 87    Temp 99 F (37.2 C) (Oral)    Resp 20    SpO2 100%   Visual Acuity Right Eye Distance:   Left Eye Distance:   Bilateral Distance:    Right Eye Near:   Left Eye Near:    Bilateral Near:     Physical Exam  Constitutional:      Appearance: He is well-developed.  Cardiovascular:     Rate and Rhythm: Normal rate.  Pulmonary:     Effort: Pulmonary effort is normal.  Skin:    General: Skin is warm and dry.     Comments: Circular raised dry patchy lesions to right inner thigh left axilla, chest, upper back; no redness   Neurological:     Mental Status: He is alert and oriented to person, place, and time.      UC Treatments / Results  Labs (all labs ordered are listed, but only abnormal results are displayed) Labs Reviewed - No data to display  EKG   Radiology No results found.  Procedures Procedures (including critical care time)  Medications Ordered in UC Medications - No data to display  Initial Impression / Assessment and Plan / UC Course  I have reviewed the triage vital signs and the nursing notes.  Pertinent labs & imaging results that were available during my care of the patient were reviewed by me and considered in my medical decision making (see chart for details).     Concern for eczema, although tinea is also considered due to the location of  lesions. Prednisone and kenalog initiated and discussed returning if worsening as may need to switch this to fungal treatment. Patient verbalized understanding and agreeable to plan.   Final Clinical Impressions(s) / UC Diagnoses   Final diagnoses:  Rash     Discharge Instructions     I believe this is a dermatitis such as eczema.  Complete the prednisone pack.  Use of kenalog cream to areas of itching.  Do not use this for longer than two weeks.  If rash is worsening please return as you may need different treatment.  Limit bathing time and keep water cool as able. Moisturize skin after showering/bathing. Limit use of fragranced laundry detergent.    ED Prescriptions    Medication Sig Dispense Auth. Provider   predniSONE (STERAPRED UNI-PAK 21 TAB) 10 MG (21) TBPK tablet Take by mouth daily. Per box instruction 21 tablet Thamas Appleyard B, NP   triamcinolone (KENALOG) 0.1 % Apply 1 application topically 2 (two) times daily. 30 g Georgetta Haber, NP     PDMP not reviewed this encounter.   Georgetta Haber, NP 05/09/20 2120

## 2020-05-09 NOTE — ED Triage Notes (Signed)
Pt in with c/o generalized rash that she noticed this week after washing her bed sheets. States she started to itch but hasn't used any new detergent or products.   Rash noted on shoulders, thighs, and back  Pt used itch relief cream with minimal relief

## 2020-05-09 NOTE — Discharge Instructions (Signed)
I believe this is a dermatitis such as eczema.  Complete the prednisone pack.  Use of kenalog cream to areas of itching.  Do not use this for longer than two weeks.  If rash is worsening please return as you may need different treatment.  Limit bathing time and keep water cool as able. Moisturize skin after showering/bathing. Limit use of fragranced laundry detergent.

## 2021-09-14 ENCOUNTER — Emergency Department (HOSPITAL_COMMUNITY)
Admission: EM | Admit: 2021-09-14 | Discharge: 2021-09-14 | Disposition: A | Discharge status: Home / Self Care | Payer: Medicaid Other | Attending: Emergency Medicine | Admitting: Emergency Medicine

## 2021-09-14 ENCOUNTER — Encounter (HOSPITAL_COMMUNITY): Payer: Self-pay | Admitting: Emergency Medicine

## 2021-09-14 ENCOUNTER — Other Ambulatory Visit: Payer: Self-pay

## 2021-09-14 ENCOUNTER — Emergency Department (HOSPITAL_COMMUNITY): Payer: Medicaid Other

## 2021-09-14 DIAGNOSIS — R0789 Other chest pain: Secondary | ICD-10-CM | POA: Diagnosis present

## 2021-09-14 DIAGNOSIS — R079 Chest pain, unspecified: Secondary | ICD-10-CM

## 2021-09-14 LAB — BASIC METABOLIC PANEL
Anion gap: 7 (ref 5–15)
BUN: 9 mg/dL (ref 6–20)
CO2: 26 mmol/L (ref 22–32)
Calcium: 9.4 mg/dL (ref 8.9–10.3)
Chloride: 103 mmol/L (ref 98–111)
Creatinine, Ser: 1.14 mg/dL (ref 0.61–1.24)
GFR, Estimated: >60 mL/min (ref >60)
Glucose, Bld: 95 mg/dL (ref 70–99)
Potassium: 4.2 mmol/L (ref 3.5–5.1)
Sodium: 136 mmol/L (ref 135–145)

## 2021-09-14 LAB — TROPONIN I (HIGH SENSITIVITY): Troponin I (High Sensitivity): 5 ng/L (ref <18)

## 2021-09-14 LAB — CBC WITH DIFFERENTIAL/PLATELET
Abs Immature Granulocytes: 0.01 10*3/uL (ref 0.00–0.07)
Basophils Absolute: 0 10*3/uL (ref 0.0–0.1)
Basophils Relative: 0 %
Eosinophils Absolute: 0.3 10*3/uL (ref 0.0–0.5)
Eosinophils Relative: 5 %
HCT: 42.4 % (ref 39.0–52.0)
Hemoglobin: 14.3 g/dL (ref 13.0–17.0)
Immature Granulocytes: 0 %
Lymphocytes Relative: 28 %
Lymphs Abs: 1.7 10*3/uL (ref 0.7–4.0)
MCH: 28.8 pg (ref 26.0–34.0)
MCHC: 33.7 g/dL (ref 30.0–36.0)
MCV: 85.3 fL (ref 80.0–100.0)
Monocytes Absolute: 0.6 10*3/uL (ref 0.1–1.0)
Monocytes Relative: 9 %
Neutro Abs: 3.6 10*3/uL (ref 1.7–7.7)
Neutrophils Relative %: 58 %
Platelets: 219 10*3/uL (ref 150–400)
RBC: 4.97 MIL/uL (ref 4.22–5.81)
RDW: 12.5 % (ref 11.5–15.5)
WBC: 6.2 10*3/uL (ref 4.0–10.5)
nRBC: 0 % (ref 0.0–0.2)

## 2021-09-14 NOTE — Discharge Instructions (Addendum)
Your x-ray, EKG and blood tests were normal. ? ?Take Tylenol and Motrin as needed for pain.  Follow-up with your doctor this week. ? ?However if your symptoms worsen or if you have trouble breathing or fevers, return immediately back to the ER. ?

## 2021-09-14 NOTE — ED Provider Notes (Signed)
?[facility] ?Provider Note ? ? ?CSN: [id number] ?Arrival date & time: 09/14/21  1559 ? ?  ? ?History ? ?Chief Complaint  ?Patient presents with  ? Chest Pain  ? ? ?Lee Brown is a 31 y.o. male. ? ?Patient presents chief complaint of chest pain.  Describes it as left-sided chest pain that started this morning around 6 to 7 AM.  He woke up without pain that he was getting breakfast ready for his kids he noticed left-sided chest pain.  Appear to be worse when he moves his head a certain way.  Denies any fall or trauma.  He suspected that maybe he had roughhouse with his kids too much day before.  However the pain persisted throughout the day and he presents to the ER for evaluation denies any fever cough denies vomiting or diarrhea.  Denies radiation of the pain elsewhere. ? ? ?  ? ?Home Medications ?Prior to Admission medications   ?Medication Sig Start Date End Date Taking? Authorizing Provider  ?cetirizine (ZYRTEC) 10 MG tablet Take 1 tablet (10 mg total) by mouth daily. 10/14/15   Gloriann Loan, PA-C  ?cyclobenzaprine (FLEXERIL) 5 MG tablet Take 1 tablet (5 mg total) by mouth at bedtime. 10/14/15   Gloriann Loan, PA-C  ?ibuprofen (ADVIL,MOTRIN) 800 MG tablet Take 1 tablet (800 mg total) by mouth 3 (three) times daily. 10/14/15   Gloriann Loan, PA-C  ?predniSONE (STERAPRED UNI-PAK 21 TAB) 10 MG (21) TBPK tablet Take by mouth daily. Per box instruction 05/09/20   Augusto Gamble B, NP  ?triamcinolone (KENALOG) 0.1 % Apply 1 application topically 2 (two) times daily. 05/09/20   Zigmund Gottron, NP  ?   ? ?Allergies    ?Patient has no known allergies.   ? ?Review of Systems   ?Review of Systems  ?Constitutional:  Negative for fever.  ?HENT:  Negative for ear pain and sore throat.   ?Eyes:  Negative for pain.  ?Respiratory:  Negative for cough.   ?Cardiovascular:  Positive for chest pain.  ?Gastrointestinal:  Negative for abdominal pain.  ?Genitourinary:  Negative for flank pain.   ?Musculoskeletal:  Negative for back pain.  ?Skin:  Negative for color change and rash.  ?Neurological:  Negative for syncope.  ?All other systems reviewed and are negative. ? ?Physical Exam ?Updated Vital Signs ?BP 124/80 (BP Location: Right Arm)   Pulse 85   Temp 98.3 ?F (36.8 ?C) (Oral)   Resp 18   SpO2 97%  ?Physical Exam ?Constitutional:   ?   Appearance: He is well-developed.  ?HENT:  ?   Head: Normocephalic.  ?   Nose: Nose normal.  ?Eyes:  ?   Extraocular Movements: Extraocular movements intact.  ?Cardiovascular:  ?   Rate and Rhythm: Normal rate.  ?Pulmonary:  ?   Effort: Pulmonary effort is normal.  ?Musculoskeletal:  ?   Comments: Left chest wall tenderness reproduced with palpation.  ?Skin: ?   Coloration: Skin is not jaundiced.  ?Neurological:  ?   Mental Status: He is alert. Mental status is at baseline.  ? ? ?ED Results / Procedures / Treatments   ?Labs ?(all labs ordered are listed, but only abnormal results are displayed) ?Labs Reviewed  ?CBC WITH DIFFERENTIAL/PLATELET  ?BASIC METABOLIC PANEL  ?TROPONIN I (HIGH SENSITIVITY)  ? ? ?EKG ?EKG Interpretation ? ?Date/Time:  Monday September 14 2021 16:12:27 EDT ?Ventricular Rate:  84 ?PR Interval:  132 ?QRS Duration: 74 ?QT Interval:  350 ?QTC Calculation: 413 ?  R Axis:   53 ?Text Interpretation: Normal sinus rhythm with sinus arrhythmia Normal ECG When compared with ECG of 06-Jul-2011 01:42, PREVIOUS ECG IS PRESENT Confirmed by Thamas Jaegers ([id number]) on 09/14/2021 6:28:36 PM ? ?Radiology ?DG Chest 2 View ? ?Result Date: 09/14/2021 ?CLINICAL DATA:  Chest pain EXAM: CHEST - 2 VIEW COMPARISON:  Chest x-ray 09/14/2020 FINDINGS: Heart size and mediastinal contours are within normal limits. No suspicious pulmonary opacities identified. No pleural effusion or pneumothorax visualized. No acute osseous abnormality appreciated. IMPRESSION: No acute intrathoracic process identified. Electronically Signed   By: Ofilia Neas M.D.   On: 09/14/2021 17:05    ? ?Procedures ?Procedures  ? ? ?Medications Ordered in ED ?Medications - No data to display ? ?ED Course/ Medical Decision Making/ A&P ?  ?                        ?Medical Decision Making ?Amount and/or Complexity of Data Reviewed ?Labs: ordered. ? ? ?Review of records shows ER visit 2 years ago for rash. ? ?Patient states he does smoke using a vape, no prior cardiac history.  No family history of cardiac disease age less than 80 per patient. ? ?Work-up shows normal appearing EKG sinus rhythm no ST elevations depressions no T wave inversions noted. ? ?Labs are sent troponin is normal.  Chemistry normal CBC normal white count normal hemoglobin normal. ? ?Chest x-ray is unremarkable as well 1 view chest read by radiologist as no infiltrate effusion or edema. ? ?I doubt acute coronary syndrome given his low risk factors and negative work-up today.  Pain is reproducible with certain neck motions and palpation of the chest.  Recommending outpatient follow-up with his doctor this week.  Recommending immediate return if he has worsening pain or any additional concerns. ? ? ? ? ? ? ? ?Final Clinical Impression(s) / ED Diagnoses ?Final diagnoses:  ?Nonspecific chest pain  ? ? ?Rx / DC Orders ?ED Discharge Orders   ? ? None  ? ?  ? ? ?  ?Luna Fuse, MD ?09/14/21 2001 ? ?

## 2021-09-14 NOTE — ED Triage Notes (Signed)
Patient complaining of musculoskeletal chest pain, states played with his kids the last few days and feels like he pulled a muscle. VSS. NAD. ?

## 2021-09-14 NOTE — ED Provider Triage Note (Signed)
Emergency Medicine Provider Triage Evaluation Note ? ?Lee Brown , a 31 y.o. male  was evaluated in triage.  Pt complains of chest pain.  Patient reports that he woke up at 7 this morning and noticed that he had left-sided chest pain.  Pain has been constant since then.  Describes pain as sharp.  Pain is worse with certain movements and with touch.  Patient states that he was playing with his daughter over the last few days who is 75 years old.  Reports that pain improved after taking Tylenol.   ? ?Patient denies any nausea, vomiting, diaphoresis, shortness of breath, palpitations, lightheadedness, syncope, leg swelling or tenderness, hemoptysis, history of DVT/PE, surgery in the last 4 weeks, history of cancer, hormone therapy. ? ?Review of Systems  ?Positive: Chest pain ?Negative: See above ? ?Physical Exam  ?BP 134/77   Pulse 87   Temp 98.5 ?F (36.9 ?C) (Oral)   Resp 16   SpO2 98%  ?Gen:   Awake, no distress   ?Resp:  Normal effort, Huel Cote to auscultation bilaterally ?MSK:   Moves extremities without difficulty  ?Other:  +2 radial pulse bilaterally.  Patient has tenderness to palpation of left chest.   ?Medical Decision Making  ?Medically screening exam initiated at 4:27 PM.  Appropriate orders placed.  Lee Brown was informed that the remainder of the evaluation will be completed by another provider, this initial triage assessment does not replace that evaluation, and the importance of remaining in the ED until their evaluation is complete. ? ? ?  ?Haskel Schroeder, PA-C ?09/14/21 1628 ? ?
